# Patient Record
Sex: Male | Born: 1990 | Race: Black or African American | Marital: Single | Smoking: Never smoker
Health system: Southern US, Community
[De-identification: ages and names within clinical notes are randomized; demographics above are authoritative.]

## PROBLEM LIST (undated history)

## (undated) DIAGNOSIS — I10 Essential (primary) hypertension: Secondary | ICD-10-CM

## (undated) HISTORY — DX: Essential (primary) hypertension: I10

---
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
## 2011-10-13 ENCOUNTER — Ambulatory Visit (INDEPENDENT_AMBULATORY_CARE_PROVIDER_SITE_OTHER): Payer: Medicare HMO | Admitting: Otolaryngology

## 2012-08-29 ENCOUNTER — Emergency Department (HOSPITAL_COMMUNITY): Payer: Medicare HMO

## 2012-08-29 ENCOUNTER — Emergency Department (HOSPITAL_COMMUNITY)
Admission: EM | Admit: 2012-08-29 | Discharge: 2012-08-30 | Disposition: A | Discharge status: Home / Self Care | Payer: Medicare HMO | Attending: Emergency Medicine | Admitting: Emergency Medicine

## 2012-08-29 ENCOUNTER — Encounter (HOSPITAL_COMMUNITY): Payer: Self-pay | Admitting: *Deleted

## 2012-08-29 DIAGNOSIS — S0990XA Unspecified injury of head, initial encounter: Secondary | ICD-10-CM

## 2012-08-29 DIAGNOSIS — W1809XA Striking against other object with subsequent fall, initial encounter: Secondary | ICD-10-CM | POA: Insufficient documentation

## 2012-08-29 DIAGNOSIS — S161XXA Strain of muscle, fascia and tendon at neck level, initial encounter: Secondary | ICD-10-CM

## 2012-08-29 DIAGNOSIS — S139XXA Sprain of joints and ligaments of unspecified parts of neck, initial encounter: Secondary | ICD-10-CM | POA: Insufficient documentation

## 2012-08-29 DIAGNOSIS — Y939 Activity, unspecified: Secondary | ICD-10-CM | POA: Insufficient documentation

## 2012-08-29 DIAGNOSIS — Y9289 Other specified places as the place of occurrence of the external cause: Secondary | ICD-10-CM | POA: Insufficient documentation

## 2012-08-29 NOTE — ED Provider Notes (Addendum)
History     CSN: [id number]  Arrival date & time 08/29/12  2157   First MD Initiated Contact with Patient 08/29/12 2309      Chief Complaint  Patient presents with  . Head Injury    (Consider location/radiation/quality/duration/timing/severity/associated sxs/prior treatment) Patient is a 22 y.o. male presenting with head injury. The history is provided by the patient.  Head Injury  The incident occurred 1 to 2 hours ago. He came to the ER via walk-in. The injury mechanism was a fall. There was no loss of consciousness. There was no blood loss. The quality of the pain is described as dull. The pain is moderate. The pain has been constant since the injury. Pertinent negatives include no numbness, no vomiting and no disorientation. He has tried nothing for the symptoms.    History reviewed. No pertinent past medical history.  History reviewed. No pertinent past surgical history.  History reviewed. No pertinent family history.  History  Substance Use Topics  . Smoking status: Never Smoker   . Smokeless tobacco: Not on file  . Alcohol Use: No      Review of Systems  Constitutional: Negative for activity change.       All ROS Neg except as noted in HPI  HENT: Negative for nosebleeds and neck pain.   Eyes: Negative for photophobia and discharge.  Respiratory: Negative for cough, shortness of breath and wheezing.   Cardiovascular: Negative for chest pain and palpitations.  Gastrointestinal: Negative for vomiting, abdominal pain and blood in stool.  Genitourinary: Negative for dysuria, frequency and hematuria.  Musculoskeletal: Negative for back pain and arthralgias.  Skin: Negative.   Neurological: Negative for dizziness, seizures, speech difficulty and numbness.  Psychiatric/Behavioral: Negative for hallucinations and confusion.    Allergies  Review of patient's allergies indicates no known allergies.  Home Medications   Current Outpatient Rx  Name  Route  Sig   Dispense  Refill  . IBUPROFEN 200 MG PO TABS   Oral   Take 400 mg by mouth once as needed. For pain           BP 141/86  Pulse 70  Temp 97.9 F (36.6 C) (Oral)  Resp 18  Ht 5\' 5"  (1.651 m)  Wt 167 lb (75.751 kg)  BMI 27.79 kg/m2  SpO2 100%  Physical Exam  Nursing note and vitals reviewed. Constitutional: He is oriented to person, place, and time. He appears well-developed and well-nourished.  Non-toxic appearance.  HENT:  Right Ear: Tympanic membrane and external ear normal.  Left Ear: Tympanic membrane and external ear normal.  Mouth/Throat: Oropharynx is clear and moist.       Occipital scalp sore. No broken skin area. Negative Battle's sign.  Eyes: EOM and lids are normal. Pupils are equal, round, and reactive to light.  Neck: Normal range of motion. Neck supple. Carotid bruit is not present.  Cardiovascular: Normal rate, regular rhythm, normal heart sounds, intact distal pulses and normal pulses.   Pulmonary/Chest: Breath sounds normal. No respiratory distress.  Abdominal: Soft. Bowel sounds are normal. There is no tenderness. There is no guarding.  Musculoskeletal: Normal range of motion.  Lymphadenopathy:       Head (right side): No submandibular adenopathy present.       Head (left side): No submandibular adenopathy present.    He has no cervical adenopathy.  Neurological: He is alert and oriented to person, place, and time. He has normal strength. No cranial nerve deficit or sensory deficit. He exhibits  normal muscle tone. Coordination normal.  Skin: Skin is warm and dry.  Psychiatric: He has a normal mood and affect. His speech is normal.    ED Course  Procedures (including critical care time)  Labs Reviewed - No data to display No results found.   No diagnosis found.    MDM  I have reviewed nursing notes, vital signs, and all appropriate lab and imaging results for this patient.  patient sustained a fall and injured the head and neck earlier this  morning. He's had continued headache during the day and presented to the emergency department for additional evaluation. The CT scan of the head is negative for any intracranial problem or cranial fracture. The cervical spine CT is negative for fracture or dislocation. The patient will be treated with diclofenac twice a day with food, Norco every 4 hours as needed for pain. Patient is to return if any changes, problems, or concerns.       Kathie Dike, PA 08/30/12 0038  Kathie Dike, PA 09/13/12 1157

## 2012-08-29 NOTE — ED Notes (Addendum)
Slipped on ice this am , fell backwards. Struck back of head on concrete. No LOC, no n/v.  Alert,   Headache,  And neck pain  C collar applied at triage.

## 2012-08-30 MED ORDER — PROMETHAZINE HCL 12.5 MG PO TABS
12.5000 mg | ORAL_TABLET | Freq: Once | ORAL | Status: AC
  Administered 2012-08-30: 12.5 mg via ORAL
  Filled 2012-08-30: qty 1

## 2012-08-30 MED ORDER — HYDROCODONE-ACETAMINOPHEN 5-325 MG PO TABS: ORAL_TABLET | ORAL | Status: DC

## 2012-08-30 MED ORDER — DICLOFENAC SODIUM 75 MG PO TBEC: 75.0000 mg | DELAYED_RELEASE_TABLET | Freq: Two times a day (BID) | ORAL | Status: AC

## 2012-08-30 MED ORDER — HYDROCODONE-ACETAMINOPHEN 5-325 MG PO TABS
2.0000 | ORAL_TABLET | Freq: Once | ORAL | Status: AC
  Administered 2012-08-30: 2 via ORAL
  Filled 2012-08-30: qty 2

## 2012-08-30 NOTE — ED Provider Notes (Signed)
Medical screening examination/treatment/procedure(s) were performed by non-physician practitioner and as supervising physician I was immediately available for consultation/collaboration.    Vida Roller, MD 08/30/12 [phone]

## 2012-09-14 NOTE — ED Provider Notes (Signed)
Medical screening examination/treatment/procedure(s) were performed by non-physician practitioner and as supervising physician I was immediately available for consultation/collaboration.    Vida Roller, MD 09/14/12 [phone]

## 2014-06-24 ENCOUNTER — Encounter (HOSPITAL_COMMUNITY): Payer: Managed Care, Other (non HMO) | Attending: Hematology and Oncology

## 2014-06-24 ENCOUNTER — Ambulatory Visit (HOSPITAL_COMMUNITY)
Admission: RE | Admit: 2014-06-24 | Discharge: 2014-06-24 | Disposition: A | Discharge status: Home / Self Care | Payer: Managed Care, Other (non HMO) | Source: Ambulatory Visit | Attending: Hematology and Oncology | Admitting: Hematology and Oncology

## 2014-06-24 ENCOUNTER — Other Ambulatory Visit (HOSPITAL_COMMUNITY): Payer: Self-pay | Admitting: Hematology and Oncology

## 2014-06-24 DIAGNOSIS — F329 Major depressive disorder, single episode, unspecified: Secondary | ICD-10-CM | POA: Diagnosis not present

## 2014-06-24 DIAGNOSIS — D751 Secondary polycythemia: Secondary | ICD-10-CM | POA: Diagnosis not present

## 2014-06-24 DIAGNOSIS — I10 Essential (primary) hypertension: Secondary | ICD-10-CM | POA: Insufficient documentation

## 2014-06-24 LAB — BLOOD GAS, ARTERIAL
Acid-Base Excess: 0.7 mmol/L (ref 0.0–2.0)
BICARBONATE: 24.8 meq/L — AB (ref 20.0–24.0)
FIO2: 0.21 %
O2 Saturation: 97.6 %
PATIENT TEMPERATURE: 37
PH ART: 7.411 (ref 7.350–7.450)
TCO2: 20.7 mmol/L (ref 0–100)
pCO2 arterial: 39.7 mmHg (ref 35.0–45.0)
pO2, Arterial: 105 mmHg — ABNORMAL HIGH (ref 80.0–100.0)

## 2014-06-24 NOTE — Progress Notes (Signed)
Labs for ca125,epo,bcr/abl,jak2

## 2014-06-25 ENCOUNTER — Encounter (HOSPITAL_BASED_OUTPATIENT_CLINIC_OR_DEPARTMENT_OTHER): Payer: Managed Care, Other (non HMO)

## 2014-06-25 ENCOUNTER — Encounter (HOSPITAL_COMMUNITY): Payer: Self-pay

## 2014-06-25 VITALS — BP 151/96 | HR 92 | Temp 97.6°F | Resp 18 | Wt 189.4 lb

## 2014-06-25 DIAGNOSIS — D509 Iron deficiency anemia, unspecified: Secondary | ICD-10-CM

## 2014-06-25 DIAGNOSIS — F329 Major depressive disorder, single episode, unspecified: Secondary | ICD-10-CM | POA: Insufficient documentation

## 2014-06-25 DIAGNOSIS — D582 Other hemoglobinopathies: Secondary | ICD-10-CM | POA: Insufficient documentation

## 2014-06-25 DIAGNOSIS — D751 Secondary polycythemia: Secondary | ICD-10-CM

## 2014-06-25 DIAGNOSIS — R718 Other abnormality of red blood cells: Secondary | ICD-10-CM

## 2014-06-25 DIAGNOSIS — I10 Essential (primary) hypertension: Secondary | ICD-10-CM | POA: Insufficient documentation

## 2014-06-25 DIAGNOSIS — F32A Depression, unspecified: Secondary | ICD-10-CM | POA: Insufficient documentation

## 2014-06-25 LAB — CA 125: CA 125: 7 U/mL (ref <35)

## 2014-06-25 NOTE — Addendum Note (Signed)
Addended by: Mellissa Kohut on: 06/25/2014 04:50 PM   Modules accepted: Orders

## 2014-06-25 NOTE — Progress Notes (Signed)
Curtis Roach presented for labwork. Labs per MD order drawn via Peripheral Line 23 gauge needle inserted in rt ac  Good blood return present. Procedure without incident.  Needle removed intact. Patient tolerated procedure well.

## 2014-06-25 NOTE — Patient Instructions (Addendum)
[place] Discharge Instructions  RECOMMENDATIONS MADE BY THE CONSULTANT AND ANY TEST RESULTS WILL BE SENT TO YOUR REFERRING PHYSICIAN.  We will see you in 2 weeks for a doctor's appointment and review your lab work. Please call for any questions. We will repeat your CBC before your next visit on Friday Nov. 20th.   Thank you for choosing [facility] to provide your oncology and hematology care.  To afford each patient quality time with our providers, please arrive at least 15 minutes before your scheduled appointment time.  With your help, our goal is to use those 15 minutes to complete the necessary work-up to ensure our physicians have the information they need to help with your evaluation and healthcare recommendations.    Effective January 1st, 2014, we ask that you re-schedule your appointment with our physicians should you arrive 10 or more minutes late for your appointment.  We strive to give you quality time with our providers, and arriving late affects you and other patients whose appointments are after yours.    Again, thank you for choosing [facility].  Our hope is that these requests will decrease the amount of time that you wait before being seen by our physicians.       _____________________________________________________________  Should you have questions after your visit to [facility], please contact our office at [phone] between the hours of 8:30 a.m. and 4:30 p.m.  Voicemails left after 4:30 p.m. will not be returned until the following business day.  For prescription refill requests, have your pharmacy contact our office with your prescription refill request.    _______________________________________________________________  We hope that we have given you very good care.  You may receive a patient satisfaction survey in the mail, please complete it and return it as soon as possible.  We value your  feedback!  _______________________________________________________________  Have you asked about our STAR program?  STAR stands for Survivorship Training and Rehabilitation, and this is a nationally recognized cancer care program that focuses on survivorship and rehabilitation.  Cancer and cancer treatments may cause problems, such as, pain, making you feel tired and keeping you from doing the things that you need or want to do. Cancer rehabilitation can help. Our goal is to reduce these troubling effects and help you have the best quality of life possible.  You may receive a survey from a nurse that asks questions about your current state of health.  Based on the survey results, all eligible patients will be referred to the [facility] program for an evaluation so we can better serve you!  A frequently asked questions sheet is available upon request.

## 2014-06-25 NOTE — Progress Notes (Signed)
Curtis Roach, M.D.  NEW PATIENT EVALUATION   Name: Curtis Roach Date: 06/25/2014 MRN: [id number] DOB: September 10, 1990  PCP: Curtis Bogus, MD   REFERRING PHYSICIAN: No ref. provider found  REASON FOR REFERRAL: Polycythemia with microcytosis     HISTORY OF PRESENT ILLNESS:Curtis Roach is a 23 y.o. male who is referred by his family physician to evaluate polycythemia with microcytosis. He is been on Wellbutrin for about one year for depression which has improved. 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 Appetite is good with no nausea, vomiting, fever, night sweats, easy satiety, diarrhea, constipation, headache, pruritus, sore throat, abdominal pain, melena, hematochezia, hematuria, erectile dysfunction, joint pain or swelling, skin rash, headache, or seizures. He does work out actively. There is no family history of polycythemia. Maternal grandmother has suffered with anemia most of her life.   PAST MEDICAL HISTORY:  has a past medical history of Hypertension.     PAST SURGICAL HISTORY:History reviewed. No pertinent past surgical history.   CURRENT MEDICATIONS: has a current medication list which includes the following prescription(s): hydrocodone-acetaminophen, ibuprofen, and lisinopril.   ALLERGIES: Review of patient's allergies indicates no known allergies.   SOCIAL HISTORY:  reports that he has never smoked. He does not have any smokeless tobacco history on file. He reports that he drinks alcohol. He reports that he does not use illicit drugs.   FAMILY HISTORY: family history includes Clotting disorder in his maternal grandmother; Stroke in his maternal grandmother.    REVIEW OF SYSTEMS:  Other than that discussed above is noncontributory.    PHYSICAL EXAM:  weight is 189 lb 6.4 oz (85.911 kg). His oral temperature is 97.6 F (36.4 C). His blood pressure is 151/96 and his pulse is 92. His  respiration is 18 and oxygen saturation is 100%.    GENERAL:alert, no distress and comfortable SKIN: skin color, texture, turgor are normal, no rashes or significant lesions EYES: normal, Conjunctiva are pink and non-injected, sclera clear OROPHARYNX:no exudate, no erythema and lips, buccal mucosa, and tongue normal  NECK: supple, thyroid normal size, non-tender, without nodularity CHEST: normal AP diameter with no gynecomastia. LYMPH:  no palpable lymphadenopathy in the cervical, axillary or inguinal LUNGS: clear to auscultation and percussion with normal breathing effort HEART: regular rate & rhythm and no murmurs ABDOMEN:abdomen soft, non-tender and normal bowel sounds. No hepatomegaly, ascites, or CVA tenderness. MUSCULOSKELETALl:no cyanosis of digits, no clubbing or edema . Well muscled NEURO: alert & oriented x 3 with fluent speech, no focal motor/sensory deficits until   LABORATORY DATA:   06/17/2014:  WBC 5.0, hemoglobin 17.9, platelets 355,000, MCV 79.5, RBC 6.69, reticulocyte count 0.7%                    Hemoglobin A 97.5%, hemoglobin 8A2: 2.5%. Hemoglobin F: 0, hemoglobin S: 0                    TSH 2.247, serum iron 100, B-12 346, folate 8.3, TIBC 383, ferritin 49  Lab on 06/24/2014  Component Date Value Ref Range Status  . CA 125 06/24/2014 7  <35 U/mL Final   Comment: (NOTE) This test was performed using the Beckman Coulter chemiluminescent method.  Values obtained from different assay methods cannot be used interchangeably.  CA125 levels , regardless of value, should not be interpreted as absolute evidence of the presence or absence of disease. **Please note change in  methodology. If re-baselining is needed, for patients who are being serially tested, please call customer service, within 3 days of collection, to request to add on the appropriate re-baselining test code for the previous methodology, at no charge.** Performed at [facility]  Outpatient Visit on 06/24/2014  Component Date Value Ref Range Status  . FIO2 06/24/2014 0.21   Final  . pH, Arterial 06/24/2014 7.411  7.350 - 7.450 Final  . pCO2 arterial 06/24/2014 39.7  35.0 - 45.0 mmHg Final  . pO2, Arterial 06/24/2014 105.0* 80.0 - 100.0 mmHg Final  . Bicarbonate 06/24/2014 24.8* 20.0 - 24.0 mEq/L Final  . TCO2 06/24/2014 20.7  0 - 100 mmol/L Final  . Acid-Base Excess 06/24/2014 0.7  0.0 - 2.0 mmol/L Final  . O2 Saturation 06/24/2014 97.6   Final  . Patient temperature 06/24/2014 37.0   Final  . Collection site 06/24/2014 RIGHT RADIAL   Final  . Drawn by 06/24/2014 COLLECTED BY RT   Final  . Sample type 06/24/2014 ARTERIAL   Final  . Allens test (pass/fail) 06/24/2014 PASS  PASS Final    Urinalysis No results found for: COLORURINE, APPEARANCEUR, LABSPEC, PHURINE, GLUCOSEU, HGBUR, BILIRUBINUR, KETONESUR, PROTEINUR, UROBILINOGEN, NITRITE, LEUKOCYTESUR    @RADIOGRAPHY : No results found.  PATHOLOGY: Peripheral smear failed to reveal evidence of premature forms. Microcytic cells are seen but no evidence of spherocytes or polychromasia.   IMPRESSION:  #1. 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 #2. Hypertension, controlled. #3. Depression, on treatment.    PLAN:  #1. Additional lab tests were done today including alpha thalassemia gene determination, erythropoietin level, PCR-ABL, JAK-2, and determination of p50. #2. The patient was reassured in the presence of his mother. #3. Follow-up in 2 weeks with CBC.  I appreciate the option of sharing in his care.   Doroteo Bradford, MD 06/25/2014 3:41 PM   DISCLAIMER:  This note was dictated with voice recognition softwre.  Similar sounding words can inadvertently be transcribed inaccurately and may not be corrected upon review.

## 2014-06-26 LAB — ERYTHROPOIETIN: Erythropoietin: 7.7 m[IU]/mL (ref 2.6–18.5)

## 2014-06-26 LAB — JAK2 GENOTYPR: JAK2 GenotypR: NOT DETECTED

## 2014-06-27 LAB — BCR/ABL GENE REARRANGEMENT QNT, PCR
BCR ABL1 / ABL1 IS: 0 %
BCR ABL1/ABL1: 0 %

## 2014-06-27 LAB — P210 BCR-ABL 1: P210 BCR ABL1: NOT DETECTED

## 2014-06-27 LAB — P190 BCR-ABL 1: P190 BCR ABL1: NOT DETECTED

## 2014-06-30 ENCOUNTER — Other Ambulatory Visit (HOSPITAL_COMMUNITY): Payer: Self-pay | Admitting: Hematology and Oncology

## 2014-06-30 DIAGNOSIS — D751 Secondary polycythemia: Secondary | ICD-10-CM

## 2014-06-30 LAB — MISCELLANEOUS TEST

## 2014-07-03 ENCOUNTER — Encounter (HOSPITAL_COMMUNITY): Payer: Managed Care, Other (non HMO)

## 2014-07-03 DIAGNOSIS — F32A Depression, unspecified: Secondary | ICD-10-CM

## 2014-07-03 DIAGNOSIS — D751 Secondary polycythemia: Secondary | ICD-10-CM

## 2014-07-03 DIAGNOSIS — I10 Essential (primary) hypertension: Secondary | ICD-10-CM

## 2014-07-03 DIAGNOSIS — F329 Major depressive disorder, single episode, unspecified: Secondary | ICD-10-CM

## 2014-07-03 DIAGNOSIS — R718 Other abnormality of red blood cells: Secondary | ICD-10-CM

## 2014-07-03 DIAGNOSIS — D582 Other hemoglobinopathies: Secondary | ICD-10-CM

## 2014-07-03 NOTE — Progress Notes (Signed)
LABS DRAWN FOR P50 REPEAT WITH DIFFERENT CONTROL

## 2014-07-04 ENCOUNTER — Ambulatory Visit (HOSPITAL_COMMUNITY): Payer: Medicare HMO

## 2014-07-04 ENCOUNTER — Other Ambulatory Visit (HOSPITAL_COMMUNITY): Payer: Managed Care, Other (non HMO)

## 2014-07-07 ENCOUNTER — Ambulatory Visit (HOSPITAL_COMMUNITY): Payer: Managed Care, Other (non HMO)

## 2014-07-07 LAB — MISCELLANEOUS TEST

## 2014-07-16 ENCOUNTER — Ambulatory Visit (HOSPITAL_COMMUNITY): Payer: Managed Care, Other (non HMO)

## 2018-11-09 ENCOUNTER — Emergency Department (HOSPITAL_COMMUNITY)
Admission: EM | Admit: 2018-11-09 | Discharge: 2018-11-09 | Disposition: A | Discharge status: Home / Self Care | Payer: Self-pay | Attending: Emergency Medicine | Admitting: Emergency Medicine

## 2018-11-09 ENCOUNTER — Other Ambulatory Visit: Payer: Self-pay

## 2018-11-09 ENCOUNTER — Encounter (HOSPITAL_COMMUNITY): Payer: Self-pay | Admitting: Emergency Medicine

## 2018-11-09 DIAGNOSIS — S81852A Open bite, left lower leg, initial encounter: Secondary | ICD-10-CM | POA: Insufficient documentation

## 2018-11-09 DIAGNOSIS — Z79899 Other long term (current) drug therapy: Secondary | ICD-10-CM | POA: Insufficient documentation

## 2018-11-09 DIAGNOSIS — Y92009 Unspecified place in unspecified non-institutional (private) residence as the place of occurrence of the external cause: Secondary | ICD-10-CM | POA: Insufficient documentation

## 2018-11-09 DIAGNOSIS — I10 Essential (primary) hypertension: Secondary | ICD-10-CM | POA: Insufficient documentation

## 2018-11-09 DIAGNOSIS — Y9389 Activity, other specified: Secondary | ICD-10-CM | POA: Insufficient documentation

## 2018-11-09 DIAGNOSIS — S81851A Open bite, right lower leg, initial encounter: Secondary | ICD-10-CM | POA: Insufficient documentation

## 2018-11-09 DIAGNOSIS — S81859A Open bite, unspecified lower leg, initial encounter: Secondary | ICD-10-CM

## 2018-11-09 DIAGNOSIS — W540XXA Bitten by dog, initial encounter: Secondary | ICD-10-CM | POA: Insufficient documentation

## 2018-11-09 DIAGNOSIS — Y990 Civilian activity done for income or pay: Secondary | ICD-10-CM | POA: Insufficient documentation

## 2018-11-09 MED ORDER — AMOXICILLIN-POT CLAVULANATE 875-125 MG PO TABS
1.0000 | ORAL_TABLET | Freq: Once | ORAL | Status: AC
  Administered 2018-11-09: 1 via ORAL
  Filled 2018-11-09: qty 1

## 2018-11-09 MED ORDER — TETANUS-DIPHTH-ACELL PERTUSSIS 5-2.5-18.5 LF-MCG/0.5 IM SUSP
0.5000 mL | Freq: Once | INTRAMUSCULAR | Status: AC
  Administered 2018-11-09: 0.5 mL via INTRAMUSCULAR
  Filled 2018-11-09: qty 0.5

## 2018-11-09 MED ORDER — BACITRACIN-NEOMYCIN-POLYMYXIN 400-5-5000 EX OINT
TOPICAL_OINTMENT | Freq: Once | CUTANEOUS | Status: AC
  Administered 2018-11-09: 1 via TOPICAL
  Filled 2018-11-09: qty 2

## 2018-11-09 MED ORDER — AMOXICILLIN-POT CLAVULANATE 875-125 MG PO TABS: 1.0000 | ORAL_TABLET | Freq: Two times a day (BID) | ORAL | 0 refills | Status: AC

## 2018-11-09 NOTE — ED Notes (Signed)
Curtis Roach Personal assistant called. Pt had previously called animal control. Officers are own the scene at this time. And will call me back with information. Pt noted to have puncture wounds to left and right lower legs

## 2018-11-09 NOTE — ED Provider Notes (Signed)
[facility] EMERGENCY DEPARTMENT Provider Note   CSN: [id number] Arrival date & time: 11/09/18  1123    History   Chief Complaint Chief Complaint  Patient presents with  . Animal Bite    HPI Curtis Roach is a 28 y.o. male.     Patient is a 28 year old male who presents to the emergency department with complaint of animal bites.  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  He has been to this person's home several times in the past.  He has gotten to know their dogs.  Today when he arrived at the patient's home, the dogs attacked him.  He sustained puncture wounds and lacerations involving the right and left lower legs.  No other injury reported.  The patient states he is not sure of the date of his last tetanus.  The animal control officers have been called, and they are investigating the rabies status of the dog's at this time.  The history is provided by the patient.  Animal Bite  Contact animal:  Dog   Past Medical History:  Diagnosis Date  . Hypertension     Patient Active Problem List   Diagnosis Date Noted  . Hypertension 06/25/2014  . Depression 06/25/2014  . Microcytosis 06/25/2014  . Elevated hemoglobin ([place]) 06/25/2014    History reviewed. No pertinent surgical history.      Home Medications    Prior to Admission medications   Medication Sig Start Date End Date Taking? Authorizing Provider  HYDROcodone-acetaminophen (NORCO/VICODIN) 5-325 MG per tablet 1 or 2 po q4h prn pain 08/30/12   Lily Kocher, PA-C  ibuprofen (ADVIL,MOTRIN) 200 MG tablet Take 400 mg by mouth once as needed. For pain    [provider]  lisinopril (PRINIVIL,ZESTRIL) 10 MG tablet Take 10 mg by mouth daily.    [provider]    Family History Family History  Problem Relation Age of Onset  . Clotting disorder Maternal Grandmother   . Stroke Maternal Grandmother     Social History Social History   Tobacco Use  . Smoking status:  Never Smoker  . Smokeless tobacco: Never Used  Substance Use Topics  . Alcohol use: Yes    Comment: occ  . Drug use: No     Allergies   Patient has no known allergies.   Review of Systems Review of Systems  Constitutional: Negative for activity change.       All ROS Neg except as noted in HPI  HENT: Negative for nosebleeds.   Eyes: Negative for photophobia and discharge.  Respiratory: Negative for cough, shortness of breath and wheezing.   Cardiovascular: Negative for chest pain and palpitations.  Gastrointestinal: Negative for abdominal pain and blood in stool.  Genitourinary: Negative for dysuria, frequency and hematuria.  Musculoskeletal: Negative for arthralgias, back pain and neck pain.  Skin: Negative.   Neurological: Negative for dizziness, seizures and speech difficulty.  Psychiatric/Behavioral: Negative for confusion and hallucinations.     Physical Exam Updated Vital Signs BP (!) 147/91 (BP Location: Right Arm)   Pulse (!) 111   Temp 98.2 F (36.8 C) (Oral)   Resp 18   Ht 5\' 6"  (1.676 m)   Wt 104.3 kg   SpO2 98%   BMI 37.12 kg/m   Physical Exam Vitals signs and nursing note reviewed.  Constitutional:      Appearance: He is well-developed. He is not toxic-appearing.  HENT:     Head: Normocephalic.  Right Ear: Tympanic membrane and external ear normal.     Left Ear: Tympanic membrane and external ear normal.  Eyes:     General: Lids are normal.     Pupils: Pupils are equal, round, and reactive to light.  Neck:     Musculoskeletal: Normal range of motion and neck supple.     Vascular: No carotid bruit.  Cardiovascular:     Rate and Rhythm: Normal rate and regular rhythm.     Pulses: Normal pulses.     Heart sounds: Normal heart sounds.  Pulmonary:     Effort: No respiratory distress.     Breath sounds: Normal breath sounds.  Abdominal:     General: Bowel sounds are normal.     Palpations: Abdomen is soft.     Tenderness: There is no  abdominal tenderness. There is no guarding.  Musculoskeletal: Normal range of motion.        General: Signs of injury present.     Comments: Patient has a laceration and puncture wounds to the lateral and posterior aspect of the upper right calf.  Bleeding is controlled.  There is full range of motion of the toes, ankle, knee, and hip of the right lower extremity.  The Achilles tendon is intact.  The dorsalis pedis pulses 2+.  There are 2 puncture wounds of the left lateral posterior leg.  There is full range of motion of the left hip, knee, ankle, and toes.  The dorsalis pedis pulses 2+.  The Achilles tendon is intact.  Lymphadenopathy:     Head:     Right side of head: No submandibular adenopathy.     Left side of head: No submandibular adenopathy.     Cervical: No cervical adenopathy.  Skin:    General: Skin is warm and dry.  Neurological:     Mental Status: He is alert and oriented to person, place, and time.     Cranial Nerves: No cranial nerve deficit.     Sensory: No sensory deficit.  Psychiatric:        Speech: Speech normal.            ED Treatments / Results  Labs (all labs ordered are listed, but only abnormal results are displayed) Labs Reviewed - No data to display  EKG None  Radiology No results found.  Procedures Procedures (including critical care time)  Medications Ordered in ED Medications - No data to display   Initial Impression / Assessment and Plan / ED Course  I have reviewed the triage vital signs and the nursing notes.  Pertinent labs & imaging results that were available during my care of the patient were reviewed by me and considered in my medical decision making (see chart for details).         Final Clinical Impressions(s) / ED Diagnoses MDM  Patient's heart rate and blood pressure elevated.  Have asked the patient to have his blood pressure rechecked soon.  Pulse oximetry is 98 to 99% on room air.  Within normal limits by my  interpretation.  The patient sustained dog bite wounds to both lower extremities.  These were cleansed, and then Neosporin bandages applied.  Patient's tetanus status was updated.  There are no neurovascular deficits appreciated of the right or left lower extremity.  The animal control officers of [place] are investigating the situation, and they will inform the patient of the rabies status of the dog that attacked him.  The patient is placed on  Augmentin 1tablet twice daily.  The patient is to use Tylenol every 4 hours for soreness.  Patient will see the primary physician or return to the emergency department if any changes in his condition or signs of advancing infection.   Final diagnoses:  Dog bite of lower leg, unspecified laterality, initial encounter    ED Discharge Orders         Ordered    amoxicillin-clavulanate (AUGMENTIN) 875-125 MG tablet  Every 12 hours     11/09/18 1418           Lily Kocher, PA-C 11/10/18 8472    Sherwood Gambler, MD 11/10/18 [phone]

## 2018-11-09 NOTE — ED Notes (Signed)
Wounds cleaned with NS at this time

## 2018-11-09 NOTE — ED Triage Notes (Signed)
Patient states he was at work and attacked by 5 dogs. Patient has laceration noted to lower right and left legs. Bleeding controlled at triage. States he called animal control in [place] and reported.

## 2018-11-09 NOTE — Discharge Instructions (Addendum)
Please cleanse the wounds daily with soap and water.  Apply Neosporin bandage until the wounds have healed.  Please use Augmentin 2 times daily with food.  Use Tylenol every 4 hours as needed for aching.  Your tetanus status was updated today.  Please update your medical records with this information.  Please notify the health department or return to the emergency department if the animal control officers find that the dogs are not in compliance with the rabies vaccinations.  Please see Dr. Luan Pulling, or return to the emergency department if any red streaking, or signs of advancing infection.

## 2018-11-12 ENCOUNTER — Ambulatory Visit (HOSPITAL_COMMUNITY)
Admission: RE | Admit: 2018-11-12 | Discharge: 2018-11-12 | Disposition: A | Discharge status: Home / Self Care | Payer: PRIVATE HEALTH INSURANCE | Source: Ambulatory Visit | Attending: Emergency Medicine | Admitting: Emergency Medicine

## 2018-11-12 DIAGNOSIS — I10 Essential (primary) hypertension: Secondary | ICD-10-CM | POA: Insufficient documentation

## 2018-11-12 DIAGNOSIS — S81859A Open bite, unspecified lower leg, initial encounter: Secondary | ICD-10-CM | POA: Insufficient documentation

## 2018-11-12 DIAGNOSIS — Z79899 Other long term (current) drug therapy: Secondary | ICD-10-CM | POA: Diagnosis not present

## 2018-11-12 MED ORDER — RABIES IMMUNE GLOBULIN 150 UNIT/ML IM INJ
INJECTION | INTRAMUSCULAR | Status: AC
  Filled 2018-11-12: qty 14

## 2018-11-12 MED ORDER — RABIES IMMUNE GLOBULIN 150 UNIT/ML IM INJ
20.0000 [IU]/kg | INJECTION | Freq: Once | INTRAMUSCULAR | Status: AC
  Administered 2018-11-12: 2100 [IU] via INTRAMUSCULAR
  Filled 2018-11-12: qty 14

## 2018-11-12 MED ORDER — RABIES VACCINE, PCEC IM SUSR
1.0000 mL | Freq: Once | INTRAMUSCULAR | Status: AC
  Administered 2018-11-12: 1 mL via INTRAMUSCULAR
  Filled 2018-11-12 (×2): qty 1

## 2018-11-15 ENCOUNTER — Other Ambulatory Visit: Payer: Self-pay

## 2018-11-15 ENCOUNTER — Encounter (HOSPITAL_COMMUNITY)
Admission: RE | Admit: 2018-11-15 | Discharge: 2018-11-15 | Disposition: A | Discharge status: Home / Self Care | Payer: PRIVATE HEALTH INSURANCE | Source: Ambulatory Visit | Attending: Emergency Medicine | Admitting: Emergency Medicine

## 2018-11-15 DIAGNOSIS — Z203 Contact with and (suspected) exposure to rabies: Secondary | ICD-10-CM | POA: Insufficient documentation

## 2018-11-15 DIAGNOSIS — Z2914 Encounter for prophylactic rabies immune globin: Secondary | ICD-10-CM | POA: Insufficient documentation

## 2018-11-15 MED ORDER — RABIES VACCINE, PCEC IM SUSR
1.0000 mL | Freq: Once | INTRAMUSCULAR | Status: AC
  Administered 2018-11-15: 1 mL via INTRAMUSCULAR

## 2018-11-15 MED ORDER — RABIES VACCINE, PCEC IM SUSR
INTRAMUSCULAR | Status: AC
  Filled 2018-11-15: qty 1

## 2018-11-19 ENCOUNTER — Encounter (HOSPITAL_COMMUNITY)
Admission: RE | Admit: 2018-11-19 | Discharge: 2018-11-19 | Disposition: A | Discharge status: Home / Self Care | Payer: PRIVATE HEALTH INSURANCE | Source: Ambulatory Visit | Attending: Emergency Medicine | Admitting: Emergency Medicine

## 2018-11-19 ENCOUNTER — Other Ambulatory Visit: Payer: Self-pay

## 2018-11-19 DIAGNOSIS — Z203 Contact with and (suspected) exposure to rabies: Secondary | ICD-10-CM | POA: Diagnosis not present

## 2018-11-19 MED ORDER — RABIES VACCINE, PCEC IM SUSR
INTRAMUSCULAR | Status: AC
  Filled 2018-11-19: qty 1

## 2018-11-19 MED ORDER — RABIES VACCINE, PCEC IM SUSR
1.0000 mL | Freq: Once | INTRAMUSCULAR | Status: AC
  Administered 2018-11-19: 10:00:00 1 mL via INTRAMUSCULAR

## 2018-11-26 ENCOUNTER — Other Ambulatory Visit: Payer: Self-pay

## 2018-11-26 ENCOUNTER — Encounter (HOSPITAL_COMMUNITY)
Admission: RE | Admit: 2018-11-26 | Discharge: 2018-11-26 | Disposition: A | Discharge status: Home / Self Care | Payer: PRIVATE HEALTH INSURANCE | Source: Ambulatory Visit | Attending: Emergency Medicine | Admitting: Emergency Medicine

## 2018-11-26 ENCOUNTER — Encounter (HOSPITAL_COMMUNITY): Payer: Self-pay

## 2018-11-26 DIAGNOSIS — Z203 Contact with and (suspected) exposure to rabies: Secondary | ICD-10-CM | POA: Diagnosis not present

## 2018-11-26 MED ORDER — RABIES VACCINE, PCEC IM SUSR
INTRAMUSCULAR | Status: AC
  Filled 2018-11-26: qty 1

## 2018-11-26 MED ORDER — RABIES VACCINE, PCEC IM SUSR
1.0000 mL | Freq: Once | INTRAMUSCULAR | Status: AC
  Administered 2018-11-26: 1 mL via INTRAMUSCULAR

## 2019-06-20 ENCOUNTER — Ambulatory Visit (INDEPENDENT_AMBULATORY_CARE_PROVIDER_SITE_OTHER): Payer: PRIVATE HEALTH INSURANCE | Admitting: Otolaryngology

## 2019-06-20 DIAGNOSIS — D1039 Benign neoplasm of other parts of mouth: Secondary | ICD-10-CM | POA: Diagnosis not present

## 2021-02-05 ENCOUNTER — Other Ambulatory Visit (HOSPITAL_COMMUNITY): Payer: Self-pay | Admitting: Family Medicine

## 2021-02-09 ENCOUNTER — Other Ambulatory Visit: Payer: Self-pay

## 2021-02-09 ENCOUNTER — Ambulatory Visit (HOSPITAL_COMMUNITY)
Admission: RE | Admit: 2021-02-09 | Discharge: 2021-02-09 | Disposition: A | Discharge status: Home / Self Care | Payer: BC Managed Care – PPO | Source: Ambulatory Visit | Attending: Family Medicine | Admitting: Family Medicine

## 2021-02-09 DIAGNOSIS — Z041 Encounter for examination and observation following transport accident: Secondary | ICD-10-CM | POA: Insufficient documentation

## 2021-04-14 ENCOUNTER — Encounter (HOSPITAL_COMMUNITY): Payer: Self-pay

## 2021-04-14 ENCOUNTER — Other Ambulatory Visit: Payer: Self-pay

## 2021-04-14 ENCOUNTER — Ambulatory Visit (INDEPENDENT_AMBULATORY_CARE_PROVIDER_SITE_OTHER): Payer: BC Managed Care – PPO | Admitting: Clinical

## 2021-04-14 DIAGNOSIS — F4310 Post-traumatic stress disorder, unspecified: Secondary | ICD-10-CM | POA: Diagnosis not present

## 2021-04-14 DIAGNOSIS — F331 Major depressive disorder, recurrent, moderate: Secondary | ICD-10-CM | POA: Diagnosis not present

## 2021-04-14 DIAGNOSIS — F419 Anxiety disorder, unspecified: Secondary | ICD-10-CM
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
## 2021-04-14 NOTE — Plan of Care (Signed)
Verbal Consent 

## 2021-04-27 ENCOUNTER — Other Ambulatory Visit: Payer: Self-pay

## 2021-04-27 ENCOUNTER — Ambulatory Visit (INDEPENDENT_AMBULATORY_CARE_PROVIDER_SITE_OTHER): Payer: BC Managed Care – PPO | Admitting: Clinical

## 2021-04-27 DIAGNOSIS — F4310 Post-traumatic stress disorder, unspecified: Secondary | ICD-10-CM

## 2021-04-27 DIAGNOSIS — F419 Anxiety disorder, unspecified: Secondary | ICD-10-CM

## 2021-04-27 DIAGNOSIS — F331 Major depressive disorder, recurrent, moderate: Secondary | ICD-10-CM

## 2021-04-27 NOTE — Progress Notes (Signed)
Virtual Visit via Telephone Note  I connected with Curtis Roach on 04/27/21 at 11:00 AM EDT by telephone and verified that I am speaking with the correct person using two identifiers.  Location: Patient: Home Provider: Office   I discussed the limitations, risks, security and privacy concerns of performing an evaluation and management service by telephone and the availability of in person appointments. I also discussed with the patient that there may be a patient responsible charge related to this service. The patient expressed understanding and agreed to proceed.  THERAPIST PROGRESS NOTE   Session Time: 11:00 AM-11:45 AM   Participation Level: Active   Behavioral Response: CasualAlertAnxious   Type of Therapy: Individual Therapy   Treatment Goals addressed: PTSD/Depression/Anxiety   Interventions: CBT   Summary: Curtis Roach is a 30 y.o. male who presents with  Depression/Anxiety/PTSD.  The OPT therapist worked with the patient for his OPT treatment. The OPT therapist utilized Motivational Interviewing to assist in creating therapeutic repore. The patient in the session was engaged and work in collaboration giving feedback about his triggers and symptoms over the past few weeks including a break up recently with his girlfriend.The OPT therapist utilized Cognitive Behavioral Therapy through cognitive restructuring as well as worked with the patient on coping strategies to assist in management of PTSD symptoms and the corresponding impact and connected effect of this on the patients ability to feel in control of his life .The OPT therapist reviewed the thought,feeling behaviors sequence of the patients non-medication therapy (OPT) and the importance of positive self talk. The OPT therapist reviewed balancing stressors with walking, watching tv, and video gaming as a coping strategies.    Suicidal/Homicidal: Nowithout intent/plan   Therapist Response: The OPT therapist worked with the  patient for the patients scheduled session. The patient was engaged in his session and gave feedback in relation to triggers, symptoms, and behavior responses over the past few weeks. The OPT therapist worked with the patient utilizing an in session Cognitive Behavioral Therapy exercise. The patient was responsive in the session and verbalized, " I am going to work on how I respond with people (reactive behavior)".  The OPT therapist continued to work in session giving praise to the patient for use of purposeful distractions to help stressors and reviewed making good decisions that put him at less risk of being triggered. The OPT therapist will continue treatment work with the patient in his next scheduled session.   Plan: Return again in 2/3 weeks.   Diagnosis:      Axis I: PTSD/ Recurrent moderate major depressive disorder with anxiety                           Axis II: No diagnosis      I discussed the assessment and treatment plan with the patient. The patient was provided an opportunity to ask questions and all were answered. The patient agreed with the plan and demonstrated an understanding of the instructions.   The patient was advised to call back or seek an in-person evaluation if the symptoms worsen or if the condition fails to improve as anticipated.   I provided 45 minutes of non-face-to-face time during this encounter.   Lennox Grumbles, LCSW   04/27/2021

## 2021-05-18 ENCOUNTER — Ambulatory Visit (HOSPITAL_COMMUNITY): Payer: BC Managed Care – PPO | Admitting: Clinical

## 2021-05-19 ENCOUNTER — Telehealth (HOSPITAL_COMMUNITY): Payer: Self-pay | Admitting: Clinical

## 2021-05-19 ENCOUNTER — Ambulatory Visit (HOSPITAL_COMMUNITY): Payer: BC Managed Care – PPO | Admitting: Clinical

## 2021-05-19 ENCOUNTER — Other Ambulatory Visit: Payer: Self-pay

## 2021-05-19 NOTE — Telephone Encounter (Signed)
Called to schedule f/u appt left vm 

## 2021-05-25 ENCOUNTER — Other Ambulatory Visit: Payer: Self-pay

## 2021-05-25 ENCOUNTER — Ambulatory Visit (INDEPENDENT_AMBULATORY_CARE_PROVIDER_SITE_OTHER): Payer: BC Managed Care – PPO | Admitting: Clinical

## 2021-05-25 DIAGNOSIS — F419 Anxiety disorder, unspecified: Secondary | ICD-10-CM | POA: Diagnosis not present

## 2021-05-25 DIAGNOSIS — F331 Major depressive disorder, recurrent, moderate: Secondary | ICD-10-CM | POA: Diagnosis not present

## 2021-05-25 DIAGNOSIS — F4310 Post-traumatic stress disorder, unspecified: Secondary | ICD-10-CM

## 2021-05-25 NOTE — Progress Notes (Signed)
Virtual Visit via Telephone Note   I connected with Curtis Roach on 05/25/21 at 9:00 AM EDT by telephone and verified that I am speaking with the correct person using two identifiers.   Location: Patient: Home Provider: Office   I discussed the limitations, risks, security and privacy concerns of performing an evaluation and management service by telephone and the availability of in person appointments. I also discussed with the patient that there may be a patient responsible charge related to this service. The patient expressed understanding and agreed to proceed.   THERAPIST PROGRESS NOTE   Session Time: 9:00 AM-9:45 AM   Participation Level: Active   Behavioral Response: CasualAlertAnxious   Type of Therapy: Individual Therapy   Treatment Goals addressed: PTSD/Depression/Anxiety   Interventions: CBT   Summary: Curtis Roach is a 30 y.o. male who presents with  Depression/Anxiety/PTSD.  The OPT therapist worked with the patient for his OPT treatment. The OPT therapist utilized Motivational Interviewing to assist in creating therapeutic repore. The patient in the session was engaged and work in collaboration giving feedback about his triggers and symptoms over the past few weeks including ongoing adjustment from a  break up recently with his girlfriend who he was with for 7years.The OPT therapist utilized Cognitive Behavioral Therapy through cognitive restructuring as well as worked with the patient on coping strategies to assist in management of PTSD symptoms and the corresponding impact and connected effect of this on the patients ability to feel in control of his life .The OPT therapist reviewed the thought,feeling behaviors sequence of the patients non-medication therapy (OPT) and the importance of positive self talk. The OPT therapist reviewed balancing stressors with walking, watching tv, and video gaming as a coping strategies. The OPT therapist reviewed self check ins and focus  on basic needs including eating/sleeping/physical exercise/ and hygiene. The OPT therapist worked with the patient on utilizing his support network.   Suicidal/Homicidal: Nowithout intent/plan   Therapist Response: The OPT therapist worked with the patient for the patients scheduled session. The patient was engaged in his session and gave feedback in relation to triggers, symptoms, and behavior responses over the past few weeks. The OPT therapist worked with the patient utilizing an in session Cognitive Behavioral Therapy exercise. The patient was responsive in the session and verbalized, " I realize I have to move forward and start getting back out of the house more and I am feeling better than I have been because I have been sleeping better".  The OPT therapist continued to work in session giving praise to the patient for use of purposeful distractions to help stressors and reviewed making good decisions that put him at less risk of being triggered. The OPT therapist will continue treatment work with the patient in his next scheduled session.   Plan: Return again in 2/3 weeks.   Diagnosis:      Axis I: PTSD/ Recurrent moderate major depressive disorder with anxiety                           Axis II: No diagnosis      I discussed the assessment and treatment plan with the patient. The patient was provided an opportunity to ask questions and all were answered. The patient agreed with the plan and demonstrated an understanding of the instructions.   The patient was advised to call back or seek an in-person evaluation if the symptoms worsen or if the condition fails to improve as  anticipated.   I provided 45 minutes of non-face-to-face time during this encounter.   Lennox Grumbles, LCSW   05/25/2021

## 2021-06-15 ENCOUNTER — Ambulatory Visit (INDEPENDENT_AMBULATORY_CARE_PROVIDER_SITE_OTHER): Payer: BC Managed Care – PPO | Admitting: Clinical

## 2021-06-15 ENCOUNTER — Other Ambulatory Visit: Payer: Self-pay

## 2021-06-15 DIAGNOSIS — F331 Major depressive disorder, recurrent, moderate: Secondary | ICD-10-CM

## 2021-06-15 DIAGNOSIS — F4310 Post-traumatic stress disorder, unspecified: Secondary | ICD-10-CM

## 2021-06-15 DIAGNOSIS — F419 Anxiety disorder, unspecified: Secondary | ICD-10-CM
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
## 2021-06-29 ENCOUNTER — Ambulatory Visit (INDEPENDENT_AMBULATORY_CARE_PROVIDER_SITE_OTHER): Payer: BC Managed Care – PPO | Admitting: Clinical

## 2021-06-29 ENCOUNTER — Other Ambulatory Visit: Payer: Self-pay

## 2021-06-29 DIAGNOSIS — F331 Major depressive disorder, recurrent, moderate: Secondary | ICD-10-CM | POA: Diagnosis not present

## 2021-06-29 DIAGNOSIS — F419 Anxiety disorder, unspecified: Secondary | ICD-10-CM | POA: Diagnosis not present

## 2021-06-29 DIAGNOSIS — F4310 Post-traumatic stress disorder, unspecified: Secondary | ICD-10-CM
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
## 2021-07-20 ENCOUNTER — Other Ambulatory Visit: Payer: Self-pay

## 2021-07-20 ENCOUNTER — Ambulatory Visit (INDEPENDENT_AMBULATORY_CARE_PROVIDER_SITE_OTHER): Payer: BC Managed Care – PPO | Admitting: Clinical

## 2021-07-20 DIAGNOSIS — F4310 Post-traumatic stress disorder, unspecified: Secondary | ICD-10-CM | POA: Diagnosis not present

## 2021-07-20 DIAGNOSIS — F419 Anxiety disorder, unspecified: Secondary | ICD-10-CM

## 2021-07-20 DIAGNOSIS — F331 Major depressive disorder, recurrent, moderate: Secondary | ICD-10-CM

## 2021-07-20 NOTE — Progress Notes (Signed)
Virtual Visit via Telephone Note   I connected with Curtis Roach on 07/20/21 at 9:00 AM EDT by telephone and verified that I am speaking with the correct person using two identifiers.   Location: Patient: Home Provider: Office   I discussed the limitations, risks, security and privacy concerns of performing an evaluation and management service by telephone and the availability of in person appointments. I also discussed with the patient that there may be a patient responsible charge related to this service. The patient expressed understanding and agreed to proceed.   THERAPIST PROGRESS NOTE   Session Time: 9:00 AM-9:30 AM   Participation Level: Active   Behavioral Response: CasualAlertAnxious   Type of Therapy: Individual Therapy   Treatment Goals addressed: PTSD/Depression/Anxiety   Interventions: CBT   Summary: Curtis Roach is a 30 y.o. male who presents with  Depression/Anxiety/PTSD.  The OPT therapist worked with the patient for his OPT treatment. The OPT therapist utilized Motivational Interviewing to assist in creating therapeutic repore. The patient in the session was engaged and work in collaboration giving feedback about his triggers and symptoms over the past few weeks. 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 The OPT therapist reviewed self check ins and focus on basic needs including eating/sleeping/physical exercise/ and hygiene. The patient identified that the upcoming Christmas Holiday will be  difficult as this will include family and potential questions about the break up with his long term girlfriend it also serves as being around what would have been the patients relationship anniversary.   Suicidal/Homicidal: Nowithout intent/plan   Therapist Response: The OPT therapist worked with the patient for the patients scheduled session. The patient was engaged in his session and gave feedback in relation to triggers, symptoms, and behavior responses over the past few weeks. The OPT therapist worked with the patient utilizing an in session Cognitive Behavioral Therapy exercise. 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 The OPT therapist will continue to work with the patient in his next scheduled session.   Plan: Return again in 2/3 weeks.   Diagnosis:      Axis I: PTSD/ Recurrent moderate major depressive disorder with anxiety                           Axis II: No diagnosis      I discussed the assessment and treatment plan with the patient. The patient was provided an opportunity to ask questions and all were answered. The patient agreed with the plan and demonstrated an understanding of the instructions.   The patient was advised to call back or seek an in-person evaluation if the symptoms worsen or if the condition fails to improve as anticipated.   I provided 30 minutes of non-face-to-face time during this encounter.  Lennox Grumbles, LCSW   07/20/2021

## 2021-08-10 ENCOUNTER — Ambulatory Visit (INDEPENDENT_AMBULATORY_CARE_PROVIDER_SITE_OTHER): Payer: BC Managed Care – PPO | Admitting: Clinical

## 2021-08-10 ENCOUNTER — Other Ambulatory Visit: Payer: Self-pay

## 2021-08-10 DIAGNOSIS — F419 Anxiety disorder, unspecified: Secondary | ICD-10-CM | POA: Diagnosis not present

## 2021-08-10 DIAGNOSIS — F4310 Post-traumatic stress disorder, unspecified: Secondary | ICD-10-CM | POA: Diagnosis not present

## 2021-08-10 DIAGNOSIS — F331 Major depressive disorder, recurrent, moderate: Secondary | ICD-10-CM

## 2021-08-10 NOTE — Progress Notes (Signed)
Virtual Visit via Telephone Note   I connected with Curtis Roach on 08/10/21 at 9:00 AM EDT by telephone and verified that I am speaking with the correct person using two identifiers.   Location: Patient: Home Provider: Office   I discussed the limitations, risks, security and privacy concerns of performing an evaluation and management service by telephone and the availability of in person appointments. I also discussed with the patient that there may be a patient responsible charge related to this service. The patient expressed understanding and agreed to proceed.   THERAPIST PROGRESS NOTE   Session Time: 9:00 AM-9:20 AM   Participation Level: Active   Behavioral Response: CasualAlertAnxious   Type of Therapy: Individual Therapy   Treatment Goals addressed: PTSD/Depression/Anxiety   Interventions: CBT   Summary: Curtis Roach is a 30 y.o. male who presents with  Depression/Anxiety/PTSD.  The OPT therapist worked with the patient for his OPT treatment. The OPT therapist utilized Motivational Interviewing to assist in creating therapeutic repore. The patient in the session was engaged and work in collaboration giving feedback about his triggers and symptoms over the past few weeks. The patient spoke about  success in his plan to get back to focusing on himself and his work, and education.with his  ongoing PHD program .The OPT therapist utilized Cognitive Behavioral Therapy through cognitive restructuring as well as worked with the patient on coping strategies to assist in management of PTSD symptoms and the corresponding impact and connected effect of this on the patients ability to feel in control of his life.The OPT therapist reviewed balancing stressors with walking, watching tv, and video gaming as a coping strategies. The OPT therapist reviewed self check ins and focus on basic needs including eating/sleeping/physical exercise/ and hygiene. The patient reviewed his Christmas Holiday.  The patient verbalized ongoing stabilization and with the OPT therapist in agreement the patient verbalized his preparedness for Discharge.   Suicidal/Homicidal: Nowithout intent/plan   Therapist Response: The OPT therapist worked with the patient for the patients scheduled session. The patient was engaged in his session and gave feedback in relation to triggers, symptoms, and behavior responses over the past few weeks. The OPT therapist worked with the patient utilizing an in session Cognitive Behavioral Therapy exercise. The patient was responsive in the session and verbalized, " Things over the Christmas holiday went well and things have continued to go really good I am not having any difficulty with my mental health now I think I have worked through my stressors and made a lot of progress".The OPT therapist continued to work in session giving praise to the patients work in his PHD program and staying focused in his goals. The OPT therapist is in agreement at this time with the patients formal OPT discharge.   Plan: Successful Discharge   Diagnosis:      Axis I: PTSD/ Recurrent moderate major depressive disorder with anxiety                           Axis II: No diagnosis      I discussed the assessment and treatment plan with the patient. The patient was provided an opportunity to ask questions and all were answered. The patient agreed with the plan and demonstrated an understanding of the instructions.   The patient was advised to call back or seek an in-person evaluation if the symptoms worsen or if the condition fails to improve as anticipated.   I provided 20 minutes  of non-face-to-face time during this encounter.   Lennox Grumbles, LCSW   08/10/2021
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
